# Patient Record
Sex: Male | Born: 2000 | Race: White | Hispanic: No | Marital: Single | Smoking: Never smoker
Health system: Southern US, Community
[De-identification: ages and names within clinical notes are randomized; demographics above are authoritative.]

---
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
## 2019-02-03 ENCOUNTER — Other Ambulatory Visit: Payer: 59

## 2019-02-03 ENCOUNTER — Other Ambulatory Visit: Payer: Self-pay

## 2019-02-03 DIAGNOSIS — Z20822 Contact with and (suspected) exposure to covid-19: Secondary | ICD-10-CM

## 2019-02-07 LAB — NOVEL CORONAVIRUS, NAA: SARS-CoV-2, NAA: NOT DETECTED

## 2023-06-10 ENCOUNTER — Ambulatory Visit: Payer: Self-pay

## 2023-06-10 ENCOUNTER — Other Ambulatory Visit: Payer: Self-pay | Admitting: Nurse Practitioner

## 2023-06-10 DIAGNOSIS — Z021 Encounter for pre-employment examination: Secondary | ICD-10-CM
